# Patient Record
Sex: Female | Born: 1970 | Race: White | Hispanic: No | Marital: Single | Smoking: Never smoker
Health system: Southern US, Community
[De-identification: ages and names within clinical notes are randomized; demographics above are authoritative.]

## PROBLEM LIST (undated history)

## (undated) DIAGNOSIS — I10 Essential (primary) hypertension: Secondary | ICD-10-CM

## (undated) DIAGNOSIS — N189 Chronic kidney disease, unspecified: Secondary | ICD-10-CM

## (undated) HISTORY — DX: Essential (primary) hypertension: I10

## (undated) HISTORY — DX: Chronic kidney disease, unspecified: N18.9

---

## 2011-01-06 ENCOUNTER — Ambulatory Visit: Payer: Self-pay | Admitting: Family Medicine

## 2011-03-08 ENCOUNTER — Ambulatory Visit: Payer: Self-pay

## 2011-05-15 ENCOUNTER — Ambulatory Visit: Payer: Self-pay

## 2012-05-27 ENCOUNTER — Ambulatory Visit: Payer: Self-pay | Admitting: Family Medicine

## 2012-06-03 ENCOUNTER — Ambulatory Visit: Payer: Self-pay | Admitting: Family Medicine

## 2012-08-20 DIAGNOSIS — F319 Bipolar disorder, unspecified: Secondary | ICD-10-CM | POA: Insufficient documentation

## 2012-08-20 DIAGNOSIS — F419 Anxiety disorder, unspecified: Secondary | ICD-10-CM | POA: Insufficient documentation

## 2012-08-20 DIAGNOSIS — F5081 Binge eating disorder: Secondary | ICD-10-CM | POA: Insufficient documentation

## 2013-03-14 ENCOUNTER — Emergency Department: Payer: Self-pay | Admitting: Emergency Medicine

## 2013-03-14 LAB — CBC
HCT: 35.8 % (ref 35.0–47.0)
HGB: 12.2 g/dL (ref 12.0–16.0)
MCH: 28.7 pg (ref 26.0–34.0)
MCHC: 34 g/dL (ref 32.0–36.0)
MCV: 84 fL (ref 80–100)
PLATELETS: 287 10*3/uL (ref 150–440)
RBC: 4.24 10*6/uL (ref 3.80–5.20)
RDW: 12.3 % (ref 11.5–14.5)
WBC: 10.4 10*3/uL (ref 3.6–11.0)

## 2013-03-14 LAB — URINALYSIS, COMPLETE
Bilirubin,UR: NEGATIVE
Blood: NEGATIVE
Glucose,UR: NEGATIVE mg/dL (ref 0–75)
KETONE: NEGATIVE
Leukocyte Esterase: NEGATIVE
NITRITE: NEGATIVE
PH: 7 (ref 4.5–8.0)
Protein: NEGATIVE
RBC, UR: NONE SEEN /HPF (ref 0–5)
SPECIFIC GRAVITY: 1.005 (ref 1.003–1.030)
WBC UR: <1 /HPF (ref 0–5)

## 2013-03-14 LAB — BASIC METABOLIC PANEL
BUN: 17 mg/dL (ref 7–18)
CHLORIDE: 104 mmol/L (ref 98–107)
CREATININE: 1.13 mg/dL (ref 0.60–1.30)
Calcium, Total: 9.2 mg/dL (ref 8.5–10.1)
Co2: 32 mmol/L (ref 21–32)
EGFR (African American): >60
EGFR (Non-African Amer.): >60
Glucose: 110 mg/dL — ABNORMAL HIGH (ref 65–99)
OSMOLALITY: 272 (ref 275–301)
Potassium: 3.9 mmol/L (ref 3.5–5.1)
SODIUM: 135 mmol/L — AB (ref 136–145)

## 2013-03-14 LAB — TROPONIN I

## 2013-04-12 ENCOUNTER — Ambulatory Visit: Payer: Self-pay | Admitting: Family Medicine

## 2013-05-07 DIAGNOSIS — J069 Acute upper respiratory infection, unspecified: Secondary | ICD-10-CM | POA: Insufficient documentation

## 2013-06-08 ENCOUNTER — Ambulatory Visit: Payer: Self-pay | Admitting: Obstetrics and Gynecology

## 2014-01-10 DIAGNOSIS — M5136 Other intervertebral disc degeneration, lumbar region: Secondary | ICD-10-CM | POA: Insufficient documentation

## 2014-04-21 DIAGNOSIS — E663 Overweight: Secondary | ICD-10-CM | POA: Insufficient documentation

## 2014-06-27 ENCOUNTER — Ambulatory Visit
Admit: 2014-06-27 | Disposition: A | Discharge status: Home / Self Care | Payer: Self-pay | Attending: Obstetrics and Gynecology | Admitting: Obstetrics and Gynecology

## 2014-06-28 DIAGNOSIS — E282 Polycystic ovarian syndrome: Secondary | ICD-10-CM | POA: Insufficient documentation

## 2014-06-28 DIAGNOSIS — F429 Obsessive-compulsive disorder, unspecified: Secondary | ICD-10-CM | POA: Insufficient documentation

## 2014-06-28 DIAGNOSIS — F4310 Post-traumatic stress disorder, unspecified: Secondary | ICD-10-CM | POA: Insufficient documentation

## 2014-06-28 DIAGNOSIS — M519 Unspecified thoracic, thoracolumbar and lumbosacral intervertebral disc disorder: Secondary | ICD-10-CM | POA: Insufficient documentation

## 2014-06-28 DIAGNOSIS — E039 Hypothyroidism, unspecified: Secondary | ICD-10-CM | POA: Insufficient documentation

## 2014-10-21 IMAGING — CR DG CHEST 2V
1 series · 2 of 2 positions shown · non-contrast
Comparison: March 14, 2013

CLINICAL DATA: Recent pneumonia

EXAM:
CHEST  2 VIEW

[Series 1: pa · 0.17mm/px · 2 of 2 slices shown]
[im 1/2]
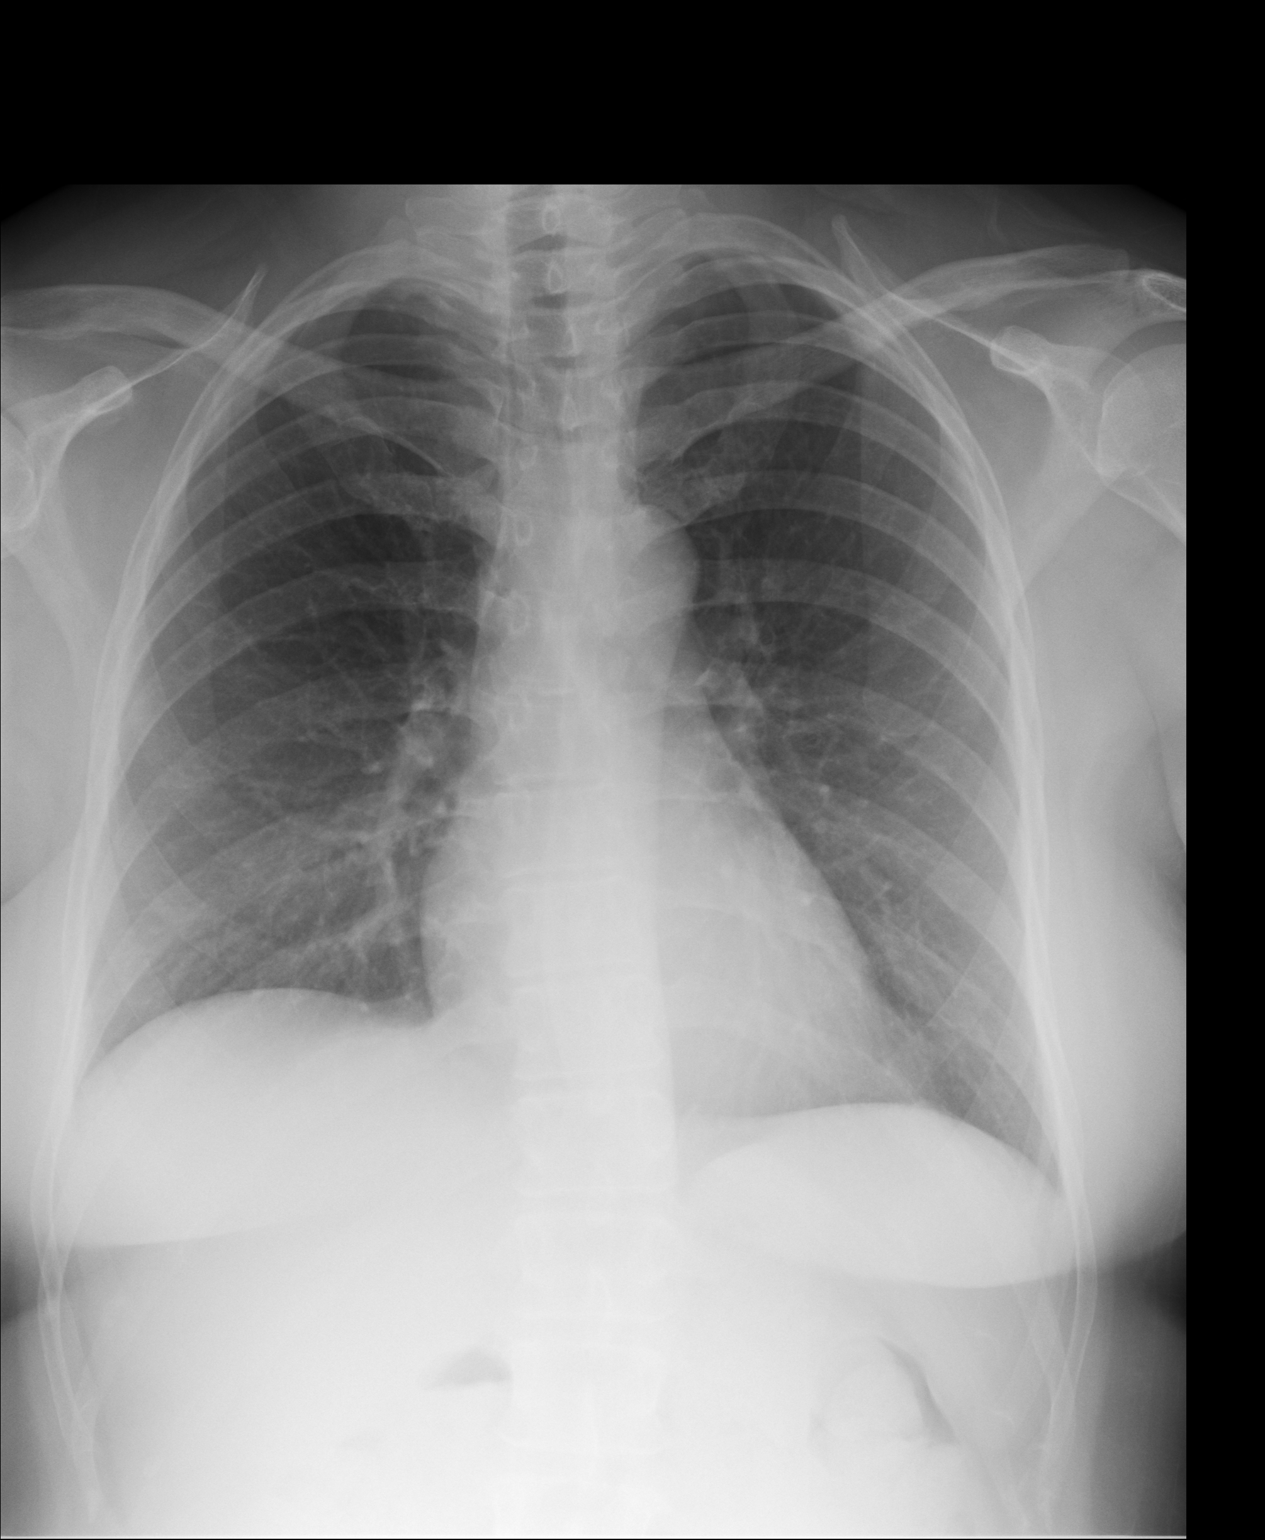
[im 2/2]
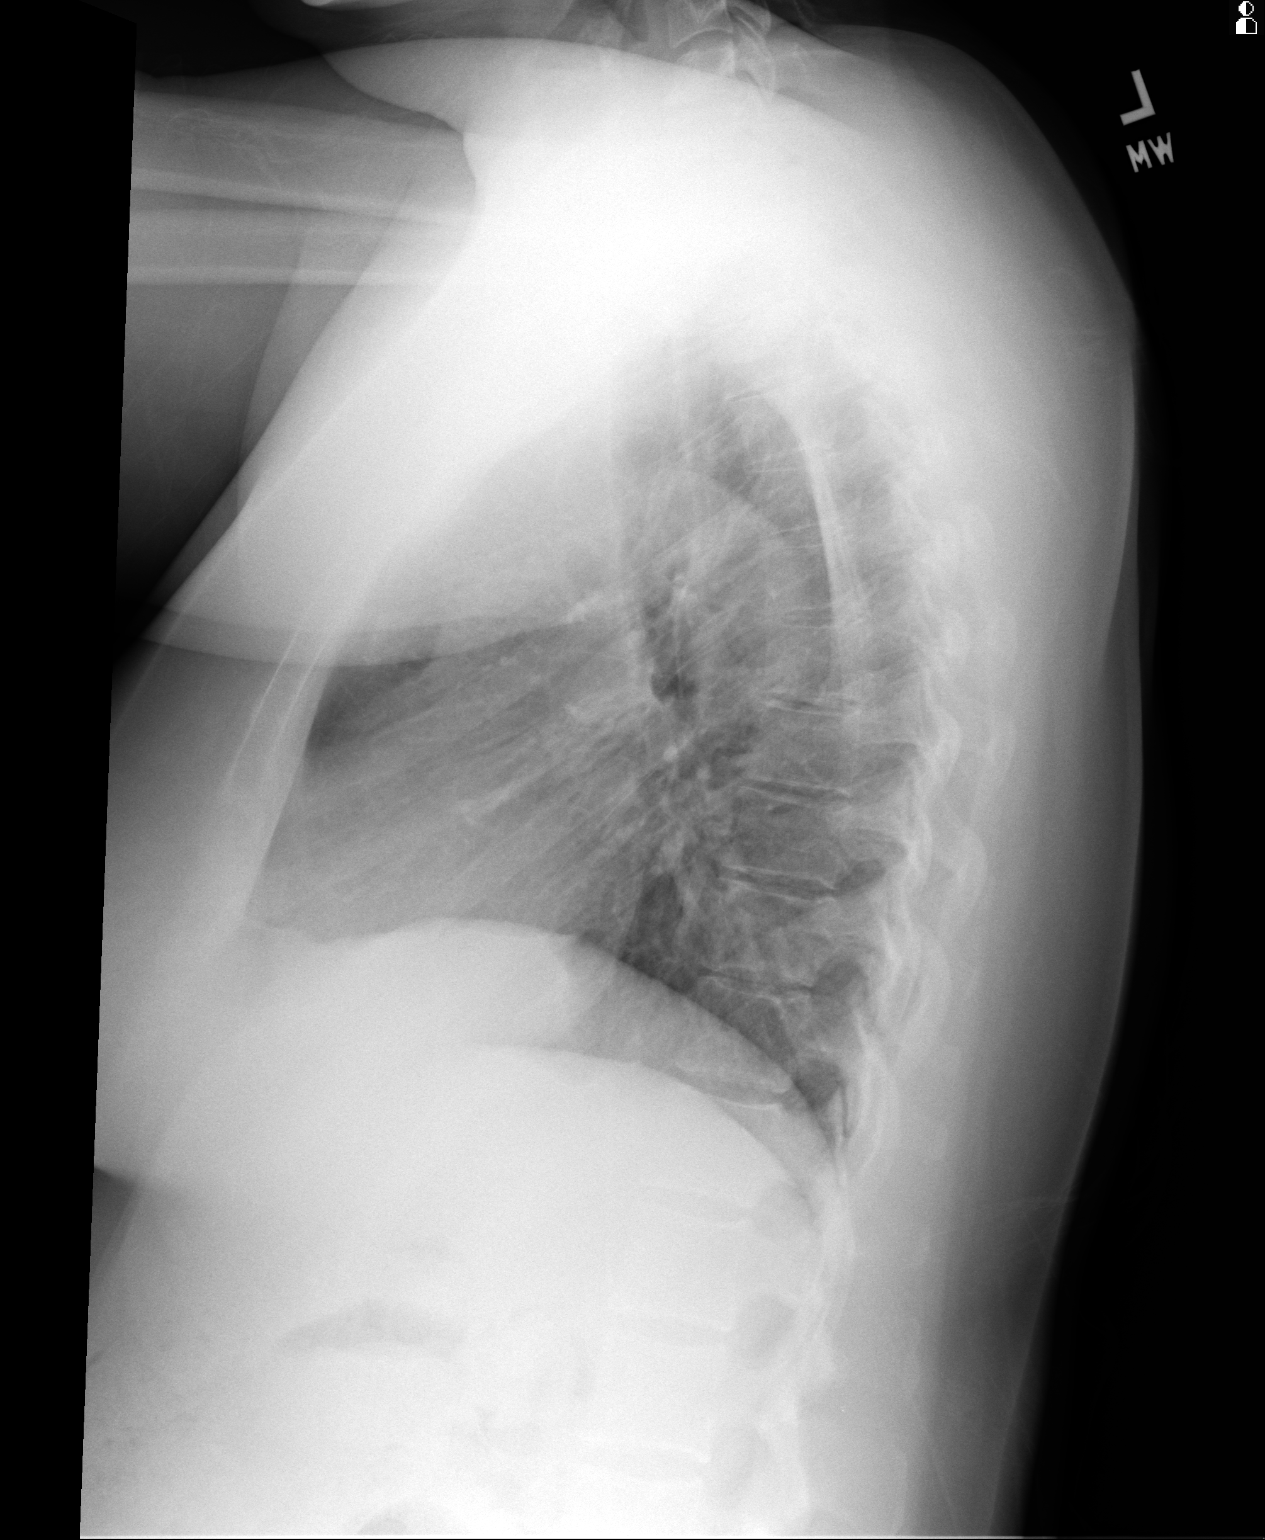

[2 of 2 positions shown; findings below may reference images not displayed]

FINDINGS: Previous infiltrate has cleared. Currently lungs are clear. Heart
size and pulmonary vascularity are normal. No adenopathy. No bone
lesions.
IMPRESSION: Lungs now clear.

## 2015-05-17 DIAGNOSIS — M5416 Radiculopathy, lumbar region: Secondary | ICD-10-CM | POA: Insufficient documentation

## 2015-12-20 DIAGNOSIS — N182 Chronic kidney disease, stage 2 (mild): Secondary | ICD-10-CM | POA: Insufficient documentation

## 2016-04-16 ENCOUNTER — Other Ambulatory Visit: Payer: Self-pay | Admitting: Obstetrics and Gynecology

## 2016-04-18 ENCOUNTER — Other Ambulatory Visit: Payer: Self-pay | Admitting: Obstetrics and Gynecology

## 2016-04-18 DIAGNOSIS — Z1231 Encounter for screening mammogram for malignant neoplasm of breast: Secondary | ICD-10-CM

## 2016-05-14 ENCOUNTER — Encounter (HOSPITAL_COMMUNITY): Payer: Self-pay

## 2016-05-14 ENCOUNTER — Ambulatory Visit
Admission: RE | Admit: 2016-05-14 | Discharge: 2016-05-14 | Disposition: A | Discharge status: Home / Self Care | Payer: Medicare Other | Source: Ambulatory Visit | Attending: Obstetrics and Gynecology | Admitting: Obstetrics and Gynecology

## 2016-05-14 DIAGNOSIS — Z1231 Encounter for screening mammogram for malignant neoplasm of breast: Secondary | ICD-10-CM | POA: Diagnosis not present

## 2016-09-27 DIAGNOSIS — R3911 Hesitancy of micturition: Secondary | ICD-10-CM | POA: Insufficient documentation

## 2016-12-23 DIAGNOSIS — I10 Essential (primary) hypertension: Secondary | ICD-10-CM | POA: Insufficient documentation

## 2016-12-24 ENCOUNTER — Telehealth: Payer: Self-pay | Admitting: *Deleted

## 2016-12-24 ENCOUNTER — Ambulatory Visit (INDEPENDENT_AMBULATORY_CARE_PROVIDER_SITE_OTHER): Payer: Medicare Other | Admitting: Internal Medicine

## 2016-12-24 ENCOUNTER — Encounter: Payer: Self-pay | Admitting: *Deleted

## 2016-12-24 ENCOUNTER — Other Ambulatory Visit: Payer: Self-pay | Admitting: *Deleted

## 2016-12-24 VITALS — BP 106/70 | HR 85 | Resp 16 | Ht 68.0 in | Wt 141.0 lb

## 2016-12-24 DIAGNOSIS — G4733 Obstructive sleep apnea (adult) (pediatric): Secondary | ICD-10-CM

## 2016-12-24 DIAGNOSIS — R102 Pelvic and perineal pain: Secondary | ICD-10-CM | POA: Insufficient documentation

## 2016-12-24 DIAGNOSIS — M519 Unspecified thoracic, thoracolumbar and lumbosacral intervertebral disc disorder: Secondary | ICD-10-CM | POA: Insufficient documentation

## 2016-12-24 DIAGNOSIS — I10 Essential (primary) hypertension: Secondary | ICD-10-CM | POA: Insufficient documentation

## 2016-12-24 DIAGNOSIS — M5126 Other intervertebral disc displacement, lumbar region: Secondary | ICD-10-CM | POA: Insufficient documentation

## 2016-12-24 DIAGNOSIS — E039 Hypothyroidism, unspecified: Secondary | ICD-10-CM | POA: Insufficient documentation

## 2016-12-24 DIAGNOSIS — Z9889 Other specified postprocedural states: Secondary | ICD-10-CM | POA: Insufficient documentation

## 2016-12-24 DIAGNOSIS — R0602 Shortness of breath: Secondary | ICD-10-CM

## 2016-12-24 DIAGNOSIS — F319 Bipolar disorder, unspecified: Secondary | ICD-10-CM | POA: Insufficient documentation

## 2016-12-24 DIAGNOSIS — M5136 Other intervertebral disc degeneration, lumbar region: Secondary | ICD-10-CM | POA: Insufficient documentation

## 2016-12-24 DIAGNOSIS — M549 Dorsalgia, unspecified: Secondary | ICD-10-CM | POA: Insufficient documentation

## 2016-12-24 DIAGNOSIS — F4310 Post-traumatic stress disorder, unspecified: Secondary | ICD-10-CM | POA: Insufficient documentation

## 2016-12-24 NOTE — Addendum Note (Signed)
Addended by: Alease Frame on: 12/24/2016 03:17 PM   Modules accepted: Orders

## 2016-12-24 NOTE — Patient Instructions (Signed)
Continue to use cpap every night.  

## 2016-12-24 NOTE — Telephone Encounter (Signed)
Left message that appointment time changed from 2pm to 2:30 due to conflict in provider schedule. Patient to arrive at 2:15 for check-in. Office number left for patient to call if she has questions.

## 2016-12-24 NOTE — Progress Notes (Signed)
[facility] [place] [facility] Consultation      Assessment and Plan:  Sleep apnea with PLMS -sleep study as reviewed below, continue CPAP at 6. -The patient would like to use a local DME provider, will refer her to [facility] home [facility].  Excessive daytime sleepiness.  -continues to have excessive daytime sleepiness, likely secondary to medication effect.  Essential hypertension.  -sleep apnea can contribute to elevated blood pressure, therefore, it is important to continue current CPAP to help maintain ideal blood pressure.  History of asbestos exposure.  -per history, the patient has a history of occupational asbestos exposure,no clinical evidence of lung disease today. -check chest x-ray.   Orders Placed This Encounter  Procedures  . DG Chest 2 View   Return in about 1 year (around 12/24/2017).   Date: 12/24/2016  MRN# [id number] Victoria Freeman 19-Sep-1970    Victoria Freeman is a 46 y.o. old female seen in consultation for chief complaint of:    Chief Complaint  Patient presents with  . Advice Only    Self Referral former [facility] patient  . Sleep Apnea    HPI:   She was diagnosed with OSA she thinks maybe 4-5 years ago at [facility], she did the sleep study in [place]. She was put on CPAP since that time, she uses it daily. She goes to bed around 9 pm, she puts it on right away, she wakes at 6 am. When she wakes in am feels relatively rested. She takes geodon, clomipramine, neurontin.  The patient has recently moved into the area, she is currently on CPAP at 6.  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  She has been doing well with supplies.   CPAP titration study. 07/27/13  IMPRESSION: 1.Best CPAP pressure setting of 6 cmH2O using a small ResMed AirFit full  face mask.REM supine sleep recorded.Apnea-hypopnea index reduced down to  3.7.Normal apnea-hypopnea index is 5.0 or less. 2.Baseline  oxygen saturation averaged 98%. 3.Mild level of PLMS at a rate of 7 per hour which might not require  additional treatment.The periodic limb movements in sleep (PLMS) during the prior  sleep study was at a higher rate at 21 per hour but now it is only 7 per hour on CPAP. 4.EKG:Normal sinus rhythm. 5.Epworth Sleepiness Scale she rates at 16 for daytime drowsiness. Normal scale is 9 or less.The sleep hypopneas and apneas noted in the prior sleep  study might be contributing to the daytime drowsiness.Other causes of daytime  drowsiness can include side effects of medications, etc. 6.She had 2 episodes of confusional arousals associated with yelling in  her sleep.  CLINICAL COMMENTS:DIAGNOSTIC ICD-9 CODE:327.23.The sleep apneas and  hypopneas noted in the prior sleep study appears to respond well to CPAP therapy. Recommend weight reduction and CPAP treatment.  RECOMMENDATIONS: 1.Recommend a weight loss program such as Weight Watchers, etc., and  have her reduce BMI below 25.Current BMI is 31.2. 2.Recommend treatment with nasal CPAP at 6 cmH2O, heated humidity using  a small ResMed AirFit F10 full face mask.Recommend a CPAP unit with  apnea-hypopnea index module and have the home care agency download compliance data and  apnea-hypopnea index data in about 4-6 weeks.  For your information some of the agencies in the area that can dispense  CPAP therapy with your physician orders include:[facility] [phone]),  [facility] [phone]), [facility], [facility], etc.If your  physician office needs assistance arranging CPAP  therapy through one of these  agencies, [facility] can be contacted at [phone].  3.In the future, recommend a referral back to [facility]  for reevaluation with repeat sleep studies for any 10-20% change in body  weight. 4.Physicians can contact Dr.  Buren Kos with any questions regarding  the results of the sleep study at [phone]. 5.Clinical correlation with all findings and recommendations.   [name], MD Diplomate, American Board of Sleep Medicine Fellow, American Academy of Sleep Medicine Medical Director, [facility]  PMHX:   Past Medical History:  Diagnosis Date  . CKD (chronic kidney disease)   . HTN (hypertension)    Surgical Hx:  No past surgical history on file. Family Hx:  Family History  Problem Relation Age of Onset  . Breast cancer Neg Hx    Social Hx:   Social History  Substance Use Topics  . Smoking status: Never Smoker  . Smokeless tobacco: Never Used  . Alcohol use No   Medication:    Current Outpatient Prescriptions:  .  amLODipine (NORVASC) 10 MG tablet, Take 10 mg by mouth daily., Disp: , Rfl:  .  clomiPRAMINE (ANAFRANIL) 50 MG capsule, Take 50 mg by mouth as directed., Disp: , Rfl:  .  gabapentin (NEURONTIN) 300 MG capsule, Take 300 mg by mouth 3 (three) times daily. , Disp: , Rfl:  .  JOLIVETTE 0.35 MG tablet, Take 1 tablet by mouth daily., Disp: , Rfl:  .  liothyronine (CYTOMEL) 5 MCG tablet, Take 5 mcg by mouth daily., Disp: , Rfl:  .  norethindrone (ORTHO MICRONOR) 0.35 MG tablet, Take 1 tablet by mouth daily., Disp: , Rfl:  .  traMADol (ULTRAM) 50 MG tablet, Take 50 mg by mouth as directed., Disp: , Rfl:  .  ziprasidone (GEODON) 20 MG capsule, Take 20 mg by mouth daily., Disp: , Rfl:  .  ziprasidone (GEODON) 60 MG capsule, Take 60 mg by mouth 2 (two) times daily with a meal. , Disp: , Rfl:    Allergies:  Poison ivy extract  Review of Systems: Gen:  Denies  fever, sweats, chills HEENT: Denies blurred vision, double vision. bleeds, sore throat Cvc:  No dizziness, chest pain. Resp:   Denies cough or sputum production, shortness of breath Gi: Denies swallowing difficulty, stomach pain. Gu:  Denies bladder incontinence, burning urine Ext:   No Joint pain,  stiffness. Skin: No skin rash,  hives  Endoc:  No polyuria, polydipsia. Psych: No depression, insomnia. Other:  All other systems were reviewed with the patient and were negative other that what is mentioned in the HPI.   Physical Examination:   VS: BP 106/70 (BP Location: Left Arm, Cuff Size: Normal)   Pulse 85   Resp 16   Ht  (1.727 m)   Wt 141 lb (64 kg)   SpO2 94%   BMI 21.44 kg/m   General Appearance: No distress  Neuro:without focal findings,  speech normal,  HEENT: PERRLA, EOM intact.  Poor dentition.  Pulmonary: normal breath sounds, No wheezing.  CardiovascularNormal S1,S2.  No m/r/g.   Abdomen: Benign, Soft, non-tender. Renal:  No costovertebral tenderness  GU:  No performed at this time. Endoc: No evident thyromegaly, no signs of acromegaly. Skin:   warm, no rashes, no ecchymosis  Extremities: normal, no cyanosis, clubbing.  Other findings:    LABORATORY PANEL:   CBC No results for input(s): WBC, HGB, HCT, PLT in the last 168 hours. ------------------------------------------------------------------------------------------------------------------  Chemistries  No results for input(s): NA, K, CL, CO2, GLUCOSE, BUN, CREATININE, CALCIUM, MG, AST, ALT, ALKPHOS, BILITOT in the last 168 hours.  Invalid input(s): GFRCGP ------------------------------------------------------------------------------------------------------------------  Cardiac Enzymes No results for input(s): TROPONINI in the last 168 hours. ------------------------------------------------------------  RADIOLOGY:  No results found.     Thank  you for the consultation and for allowing [facility] to assist in the care of your patient. Our recommendations are noted above.  Please contact us if we can be of further service.   Wells Guiles, MD.  Board Certified in Internal Medicine, Pulmonary Medicine, Critical Care Medicine, and Sleep Medicine.  [facility] [place]  [facility] Office Number: [phone]  Santiago Glad, M.D.  Billy Fischer, M.D  12/24/2016

## 2016-12-26 ENCOUNTER — Ambulatory Visit
Admission: RE | Admit: 2016-12-26 | Discharge: 2016-12-26 | Disposition: A | Discharge status: Home / Self Care | Payer: Medicare Other | Source: Ambulatory Visit | Attending: Internal Medicine | Admitting: Internal Medicine

## 2016-12-26 DIAGNOSIS — R0602 Shortness of breath: Secondary | ICD-10-CM | POA: Diagnosis present

## 2017-04-09 ENCOUNTER — Other Ambulatory Visit: Payer: Self-pay | Admitting: Obstetrics and Gynecology

## 2017-04-09 ENCOUNTER — Other Ambulatory Visit: Payer: Self-pay | Admitting: Family Medicine

## 2017-04-09 DIAGNOSIS — Z1231 Encounter for screening mammogram for malignant neoplasm of breast: Secondary | ICD-10-CM

## 2017-05-21 ENCOUNTER — Ambulatory Visit
Admission: RE | Admit: 2017-05-21 | Discharge: 2017-05-21 | Disposition: A | Discharge status: Home / Self Care | Payer: Medicare Other | Source: Ambulatory Visit | Attending: Family Medicine | Admitting: Family Medicine

## 2017-05-21 DIAGNOSIS — Z1231 Encounter for screening mammogram for malignant neoplasm of breast: Secondary | ICD-10-CM

## 2017-12-16 ENCOUNTER — Telehealth: Payer: Self-pay | Admitting: Pulmonary Disease

## 2017-12-16 NOTE — Telephone Encounter (Signed)
[facility] seen at [facility] for [facility]ology declined to schedule recall. Deleting per patient request.

## 2021-06-14 ENCOUNTER — Institutional Professional Consult (permissible substitution): Payer: Medicare Other | Admitting: Adult Health

## 2022-06-19 ENCOUNTER — Ambulatory Visit
Admission: EM | Admit: 2022-06-19 | Discharge: 2022-06-19 | Disposition: A | Discharge status: Home / Self Care | Payer: Medicare Other | Attending: Urgent Care | Admitting: Urgent Care

## 2022-06-19 ENCOUNTER — Encounter: Payer: Self-pay | Admitting: Emergency Medicine

## 2022-06-19 DIAGNOSIS — R350 Frequency of micturition: Secondary | ICD-10-CM | POA: Insufficient documentation

## 2022-06-19 LAB — URINALYSIS, W/ REFLEX TO CULTURE (INFECTION SUSPECTED)
Bilirubin Urine: NEGATIVE
Glucose, UA: NEGATIVE mg/dL
Hgb urine dipstick: NEGATIVE
Ketones, ur: NEGATIVE mg/dL
Leukocytes,Ua: NEGATIVE
Nitrite: NEGATIVE
Protein, ur: NEGATIVE mg/dL
RBC / HPF: NONE SEEN RBC/hpf (ref 0–5)
Specific Gravity, Urine: 1.01 (ref 1.005–1.030)
pH: 6 (ref 5.0–8.0)

## 2022-06-19 NOTE — Discharge Instructions (Signed)
Follow up here or with your primary care provider if your symptoms are worsening or not improving.     

## 2022-06-19 NOTE — ED Triage Notes (Signed)
Pt presents with urinary frequency that started today.

## 2022-06-19 NOTE — ED Provider Notes (Signed)
[facility]    CSN: [id number] Arrival date & time: 06/19/22  1920      History   Chief Complaint Chief Complaint  Patient presents with   Urinary Frequency    HPI Victoria Freeman is a 52 y.o. female.    Urinary Frequency    Presents to urgent care with urine frequency x 1 day.  Denies abdominal pain.  Denies dysuria.  Denies flank pain.  Denies fever.  Past Medical History:  Diagnosis Date   CKD (chronic kidney disease)    HTN (hypertension)     Patient Active Problem List   Diagnosis Date Noted   Backache 12/24/2016   Bipolar disorder 12/24/2016   Degeneration of lumbar intervertebral disc 12/24/2016   Displacement of lumbar intervertebral disc without myelopathy 12/24/2016   History of lumbar laminectomy 12/24/2016   Hypertensive disorder 12/24/2016   Hypothyroidism 12/24/2016   Intervertebral disc disorder 12/24/2016   Obstructive sleep apnea syndrome 12/24/2016   Pelvic pain in female 12/24/2016   Posttraumatic stress disorder 12/24/2016   Essential hypertension 12/23/2016   Urinary hesitancy 09/27/2016   CKD (chronic kidney disease) stage 2, GFR 60-89 ml/min 12/20/2015   Left lumbar radiculitis 05/17/2015   Acquired hypothyroidism 06/28/2014   Lumbar disc disease 06/28/2014   OCD (obsessive compulsive disorder) 06/28/2014   PCOS (polycystic ovarian syndrome) 06/28/2014   Post traumatic stress disorder 06/28/2014   Overweight (BMI 25.0-29.9) 04/21/2014   Degenerative disc disease, lumbar 01/10/2014   URI (upper respiratory infection) 05/07/2013   Anxiety 08/20/2012   Bipolar depression 08/20/2012   Recurrent binge eating 08/20/2012    History reviewed. No pertinent surgical history.  OB History   No obstetric history on file.      Home Medications    Prior to Admission medications   Medication Sig Start Date End Date Taking? Authorizing Provider  amLODipine (NORVASC) 10 MG tablet Take 10 mg by mouth daily. 12/23/16  Yes  [provider]  clomiPRAMINE (ANAFRANIL) 50 MG capsule Take 50 mg by mouth as directed. 10/12/16  Yes [provider]  gabapentin (NEURONTIN) 300 MG capsule Take 300 mg by mouth 3 (three) times daily.  12/19/16  Yes [provider]  liothyronine (CYTOMEL) 5 MCG tablet Take 5 mcg by mouth daily.   Yes [provider]  memantine (NAMENDA) 5 MG tablet 1 tablet Orally Once a day for 90 days 03/17/21  Yes [provider]  traZODone (DESYREL) 100 MG tablet 3 tablets at bedtime Orally Once a day for 90 days 04/12/20  Yes [provider]  ziprasidone (GEODON) 20 MG capsule Take 20 mg by mouth daily.   Yes [provider]  Cholecalciferol 25 MCG (1000 UT) capsule Take by mouth.    [provider]  JOLIVETTE 0.35 MG tablet Take 1 tablet by mouth daily. 11/01/16   [provider]  Multiple Vitamin (MULTI-VITAMIN) tablet Take 1 tablet by mouth daily.    [provider]  norethindrone (ORTHO MICRONOR) 0.35 MG tablet Take 1 tablet by mouth daily.    [provider]  ondansetron (ZOFRAN-ODT) 8 MG disintegrating tablet Take by mouth.    [provider]  tamoxifen (NOLVADEX) 20 MG tablet Take 20 mg by mouth daily.    [provider]  traMADol (ULTRAM) 50 MG tablet Take 50 mg by mouth as directed.    [provider]  ziprasidone (GEODON) 60 MG capsule Take 60 mg by mouth 2 (two) times daily with a meal.  [provider]    Family History Family History  Problem Relation Age of Onset   Breast cancer Neg Hx     Social History Social History   Tobacco Use   Smoking status: Never   Smokeless tobacco: Never  Vaping Use   Vaping Use: Never used  Substance Use Topics   Alcohol use: No   Drug use: No     Allergies   Poison ivy extract, Vitamin e, and Bupropion   Review of Systems Review of Systems  Genitourinary:  Positive for frequency.     Physical Exam Triage  Vital Signs ED Triage Vitals  Enc Vitals Group     BP 06/19/22 1934 114/75     Pulse Rate 06/19/22 1934 (!) 58     Resp 06/19/22 1934 16     Temp 06/19/22 1934 98.6 F (37 C)     Temp Source 06/19/22 1934 Oral     SpO2 06/19/22 1934 99 %     Weight --      Height --      Head Circumference --      Peak Flow --      Pain Score 06/19/22 1930 0     Pain Loc --      Pain Edu? --      Excl. in GC? --    No data found.  Updated Vital Signs BP 114/75 (BP Location: Left Arm)   Pulse (!) 58   Temp 98.6 F (37 C) (Oral)   Resp 16   SpO2 99%   Visual Acuity Right Eye Distance:   Left Eye Distance:   Bilateral Distance:    Right Eye Near:   Left Eye Near:    Bilateral Near:     Physical Exam Vitals reviewed.  Constitutional:      Appearance: Normal appearance.  Skin:    General: Skin is warm and dry.  Neurological:     General: No focal deficit present.     Mental Status: She is alert and oriented to person, place, and time.  Psychiatric:        Mood and Affect: Mood normal.        Behavior: Behavior normal.      UC Treatments / Results  Labs (all labs ordered are listed, but only abnormal results are displayed) Labs Reviewed  URINALYSIS, W/ REFLEX TO CULTURE (INFECTION SUSPECTED) - Abnormal; Notable for the following components:      Result Value   Bacteria, UA RARE (*)    All other components within normal limits    EKG   Radiology No results found.  Procedures Procedures (including critical care time)  Medications Ordered in UC Medications - No data to display  Initial Impression / Assessment and Plan / UC Course  I have reviewed the triage vital signs and the nursing notes.  Pertinent labs & imaging results that were available during my care of the patient were reviewed by me and considered in my medical decision making (see chart for details).   UA is nonsuggestive of urinary tract infection.  Specific gravity suggests well-hydrated.  Patient  advised to watch and wait for worsening or continuing symptoms.   Final Clinical Impressions(s) / UC Diagnoses   Final diagnoses:  None   Discharge Instructions   None    ED Prescriptions   None    PDMP not reviewed this encounter.   Charma Igo, [place] 06/19/22 1944
